# Patient Record
Sex: Female | Born: 1935 | Race: White | Hispanic: No | Marital: Married | Smoking: Never smoker
Health system: Southern US, Community
[De-identification: ages and names within clinical notes are randomized; demographics above are authoritative.]

## PROBLEM LIST (undated history)

## (undated) DIAGNOSIS — E7800 Pure hypercholesterolemia, unspecified: Secondary | ICD-10-CM

## (undated) DIAGNOSIS — I82409 Acute embolism and thrombosis of unspecified deep veins of unspecified lower extremity: Secondary | ICD-10-CM

## (undated) DIAGNOSIS — I639 Cerebral infarction, unspecified: Secondary | ICD-10-CM

## (undated) DIAGNOSIS — I2510 Atherosclerotic heart disease of native coronary artery without angina pectoris: Secondary | ICD-10-CM

## (undated) DIAGNOSIS — M1990 Unspecified osteoarthritis, unspecified site: Secondary | ICD-10-CM

## (undated) HISTORY — PX: BACK SURGERY: SHX140

## (undated) HISTORY — PX: ABDOMINAL HYSTERECTOMY: SHX81

---

## 2013-12-09 DEATH — deceased

## 2014-01-14 ENCOUNTER — Encounter (INDEPENDENT_AMBULATORY_CARE_PROVIDER_SITE_OTHER): Payer: Medicare Other | Admitting: Ophthalmology

## 2014-01-14 DIAGNOSIS — H3530 Unspecified macular degeneration: Secondary | ICD-10-CM

## 2014-01-14 DIAGNOSIS — H43819 Vitreous degeneration, unspecified eye: Secondary | ICD-10-CM

## 2020-05-22 ENCOUNTER — Encounter (HOSPITAL_BASED_OUTPATIENT_CLINIC_OR_DEPARTMENT_OTHER): Payer: Self-pay | Admitting: Emergency Medicine

## 2020-05-22 ENCOUNTER — Emergency Department (HOSPITAL_BASED_OUTPATIENT_CLINIC_OR_DEPARTMENT_OTHER)
Admission: EM | Admit: 2020-05-22 | Discharge: 2020-05-22 | Disposition: A | Discharge status: Home / Self Care | Payer: Medicare Other | Attending: Emergency Medicine | Admitting: Emergency Medicine

## 2020-05-22 ENCOUNTER — Emergency Department (HOSPITAL_BASED_OUTPATIENT_CLINIC_OR_DEPARTMENT_OTHER): Payer: Medicare Other

## 2020-05-22 ENCOUNTER — Other Ambulatory Visit: Payer: Self-pay

## 2020-05-22 DIAGNOSIS — S7002XA Contusion of left hip, initial encounter: Secondary | ICD-10-CM | POA: Insufficient documentation

## 2020-05-22 DIAGNOSIS — W19XXXA Unspecified fall, initial encounter: Secondary | ICD-10-CM | POA: Diagnosis not present

## 2020-05-22 DIAGNOSIS — Y9289 Other specified places as the place of occurrence of the external cause: Secondary | ICD-10-CM | POA: Insufficient documentation

## 2020-05-22 DIAGNOSIS — Y999 Unspecified external cause status: Secondary | ICD-10-CM | POA: Insufficient documentation

## 2020-05-22 DIAGNOSIS — Z7901 Long term (current) use of anticoagulants: Secondary | ICD-10-CM | POA: Insufficient documentation

## 2020-05-22 DIAGNOSIS — I2510 Atherosclerotic heart disease of native coronary artery without angina pectoris: Secondary | ICD-10-CM | POA: Diagnosis not present

## 2020-05-22 DIAGNOSIS — Y939 Activity, unspecified: Secondary | ICD-10-CM | POA: Diagnosis not present

## 2020-05-22 DIAGNOSIS — S8011XA Contusion of right lower leg, initial encounter: Secondary | ICD-10-CM | POA: Diagnosis not present

## 2020-05-22 DIAGNOSIS — S79912A Unspecified injury of left hip, initial encounter: Secondary | ICD-10-CM | POA: Diagnosis present

## 2020-05-22 DIAGNOSIS — Z79899 Other long term (current) drug therapy: Secondary | ICD-10-CM | POA: Insufficient documentation

## 2020-05-22 HISTORY — DX: Atherosclerotic heart disease of native coronary artery without angina pectoris: I25.10

## 2020-05-22 HISTORY — DX: Acute embolism and thrombosis of unspecified deep veins of unspecified lower extremity: I82.409

## 2020-05-22 HISTORY — DX: Pure hypercholesterolemia, unspecified: E78.00

## 2020-05-22 HISTORY — DX: Unspecified osteoarthritis, unspecified site: M19.90

## 2020-05-22 HISTORY — DX: Cerebral infarction, unspecified: I63.9

## 2020-05-22 NOTE — ED Provider Notes (Signed)
[facility] EMERGENCY DEPARTMENT Provider Note   CSN: [id number] Arrival date & time: 05/22/20  1104     History Chief Complaint  Patient presents with  . Fall    Tina Russo is a 86 y.o. female.  86 yo F with chief complaints of left hip pain after fall about a week ago.  She also noticed a knot to the posterior aspect of her right knee.  Family is concerned that she might have a DVT and brought her here for evaluation.  She otherwise has been acting normally.  Has been able to ambulate as per her baseline with a walker.  Denied head injury denies loss consciousness denies chest pain abdominal pain back pain.  The history is provided by the patient and a relative.  Fall This is a new problem. The current episode started more than 2 days ago. The problem occurs rarely. The problem has been resolved. Pertinent negatives include no chest pain, no abdominal pain, no headaches and no shortness of breath. Nothing aggravates the symptoms. Nothing relieves the symptoms. She has tried nothing for the symptoms. The treatment provided no relief.       Past Medical History:  Diagnosis Date  . Arthritis   . Coronary artery disease   . DVT (deep venous thrombosis) (HCC)   . High cholesterol   . Stroke [facility])     There are no problems to display for this patient.   Past Surgical History:  Procedure Laterality Date  . ABDOMINAL HYSTERECTOMY    . BACK SURGERY       OB History   No obstetric history on file.     No family history on file.  Social History   Tobacco Use  . Smoking status: Never Smoker  . Smokeless tobacco: Never Used  Substance Use Topics  . Alcohol use: Never  . Drug use: Never    Home Medications Prior to Admission medications   Medication Sig Start Date End Date Taking? Authorizing Provider  apixaban (ELIQUIS) 5 MG TABS tablet TAKE 2 TABS TWICE A DAY FOR ONE WEEK THEN ONE TAB TWICE A DAY 03/25/19  Yes [provider]  atorvastatin  (LIPITOR) 80 MG tablet Take 1 tablet by mouth daily. 12/06/16  Yes [provider]  citalopram (CELEXA) 40 MG tablet Take 1 tablet by mouth daily. 04/04/17  Yes [provider]  diclofenac Sodium (VOLTAREN) 1 % GEL Apply to affected area/heels up to 3 times daily as directed 02/13/19  Yes [provider]  ferrous sulfate 325 (65 FE) MG tablet TAKE 1 TABLET BY MOUTH EVERY DAY WITH BREAKFAST 01/31/17  Yes [provider]  furosemide (LASIX) 20 MG tablet Take by mouth. 05/04/20  Yes [provider]  latanoprost (XALATAN) 0.005 % ophthalmic solution Place 1 drop into both eyes nightly. 09/11/12  Yes [provider]  nitroGLYCERIN (NITROSTAT) 0.4 MG SL tablet Place under the tongue. 01/19/15  Yes [provider]  pantoprazole (PROTONIX) 20 MG tablet Take 1 tablet by mouth daily. 03/15/19  Yes [provider]  timolol (TIMOPTIC) 0.5 % ophthalmic solution Place 1 drop into both eyes 2 times daily. 06/07/14  Yes [provider]  acetaminophen (TYLENOL) 500 MG tablet Take by mouth.    [provider]  Calcium Citrate-Vitamin D 315-250 MG-UNIT TABS Take by mouth.    [provider]  glucosamine-chondroitin 500-400 MG tablet Take 1 tablet by mouth 2 (two) times daily.    [provider]  Multiple  Vitamins-Minerals (SYSTANE ICAPS AREDS2) CAPS Take by mouth.    [provider]    Allergies    Codeine  Review of Systems   Review of Systems  Constitutional: Negative for chills and fever.  HENT: Negative for congestion and rhinorrhea.   Eyes: Negative for redness and visual disturbance.  Respiratory: Negative for shortness of breath and wheezing.   Cardiovascular: Negative for chest pain and palpitations.  Gastrointestinal: Negative for abdominal pain, nausea and vomiting.  Genitourinary: Negative for dysuria and urgency.  Musculoskeletal: Positive for arthralgias and myalgias.  Skin: Negative for  pallor and wound.  Neurological: Negative for dizziness and headaches.    Physical Exam Updated Vital Signs BP (!) 154/94 (BP Location: Left Arm)   Pulse 75   Temp 98.7 F (37.1 C) (Oral)   Resp 19   Ht 5\' 1"  (1.549 m)   Wt 68 kg   SpO2 100%   BMI 28.34 kg/m   Physical Exam Vitals and nursing note reviewed.  Constitutional:      General: She is not in acute distress.    Appearance: She is well-developed. She is not diaphoretic.  HENT:     Head: Normocephalic and atraumatic.  Eyes:     Pupils: Pupils are equal, round, and reactive to light.  Cardiovascular:     Rate and Rhythm: Normal rate and regular rhythm.     Heart sounds: No murmur heard.  No friction rub. No gallop.   Pulmonary:     Effort: Pulmonary effort is normal.     Breath sounds: No wheezing or rales.  Abdominal:     General: There is no distension.     Palpations: Abdomen is soft.     Tenderness: There is no abdominal tenderness.  Musculoskeletal:        General: Tenderness present.     Cervical back: Normal range of motion and neck supple.     Comments: Bruising to the left lateral aspect of the upper leg distal to the greater trochanter.  Internal/external rotation without pain.  No pain with compression of the pelvis.  Small nodule within an area of hematoma to the right medial calf.  Skin:    General: Skin is warm and dry.  Neurological:     Mental Status: She is alert and oriented to person, place, and time.  Psychiatric:        Behavior: Behavior normal.     ED Results / Procedures / Treatments   Labs (all labs ordered are listed, but only abnormal results are displayed) Labs Reviewed - No data to display  EKG None  Radiology Venous Img Lower Unilateral Right  Result Date: 05/22/2020 CLINICAL DATA:  Recent fall with palpable lump in the medial aspect of the right knee EXAM: RIGHT LOWER EXTREMITY VENOUS DOPPLER ULTRASOUND TECHNIQUE: Gray-scale sonography with graded compression, as  well as color Doppler and duplex ultrasound were performed to evaluate the lower extremity deep venous systems from the level of the common femoral vein and including the common femoral, femoral, profunda femoral, popliteal and calf veins including the posterior tibial, peroneal and gastrocnemius veins when visible. The superficial great saphenous vein was also interrogated. Spectral Doppler was utilized to evaluate flow at rest and with distal augmentation maneuvers in the common femoral, femoral and popliteal veins. COMPARISON:  None. FINDINGS: Contralateral Common Femoral Vein: Respiratory phasicity is normal and symmetric with the symptomatic side. No evidence of thrombus. Normal compressibility. Common Femoral Vein: No evidence of thrombus. Normal compressibility,  respiratory phasicity and response to augmentation. Saphenofemoral Junction: No evidence of thrombus. Normal compressibility and flow on color Doppler imaging. Profunda Femoral Vein: No evidence of thrombus. Normal compressibility and flow on color Doppler imaging. Femoral Vein: Limited evaluation. Patient could not tolerate the pressure of the ultrasound probe. Normal patent and compressible femoral vein in the proximal and mid thigh. Limited assessment in the distal thigh. Popliteal Vein: No evidence of thrombus. Normal compressibility, respiratory phasicity and response to augmentation. Calf Veins: No evidence of thrombus. Normal compressibility and flow on color Doppler imaging. Superficial Great Saphenous Vein: No evidence of thrombus. Normal compressibility. Venous Reflux:  None. Other Findings: Limited sonographic interrogation of the region of concern in the medial popliteal fossa demonstrates a 1.2 x 1.1 x 0.8 cm complex hypoechoic collection. No internal vascularity. IMPRESSION: 1. No evidence of deep venous thrombosis in the right lower extremity. Of note, the examination is mildly limited secondary to patient's pain and inability to  tolerate imaging in the distal thigh. 2. Small heterogeneous fluid collection in the medial popliteal fossa may represent a hematoma, complex Baker's cyst, or, less likely, a small abscess. Electronically Signed   By: Malachy MoanHeath  McCullough M.D.   On: 05/22/2020 14:04   DG Hip Unilat W or Wo Pelvis 2-3 Views Left  Result Date: 05/22/2020 CLINICAL DATA:  86 year old who fell 5 days ago and has persistent LEFT hip pain. Initial encounter. EXAM: DG HIP (WITH OR WITHOUT PELVIS) 2-3V LEFT COMPARISON:  Bone window images from CT abdomen and pelvis 10/29/2019. FINDINGS: Bone mineral density well preserved for patient age. No evidence of acute or subacute fracture or dislocation. Joint space well-preserved for patient age. Included AP pelvis demonstrates a well-preserved joint space in the contralateral RIGHT hip. Sacroiliac joints and symphysis pubis anatomically aligned without diastasis, though moderate degenerative changes are present. Degenerative changes involving the visualized lower lumbar spine. IMPRESSION: No acute or subacute osseous abnormality. Electronically Signed   By: Hulan Saashomas  Lawrence M.D.   On: 05/22/2020 14:26    Procedures Procedures (including critical care time)  Medications Ordered in ED Medications - No data to display  ED Course  I have reviewed the triage vital signs and the nursing notes.  Pertinent labs & imaging results that were available during my care of the patient were reviewed by me and considered in my medical decision making (see chart for details).    MDM Rules/Calculators/A&P                          86 yo  F with a chief complaints of a fall.  The patient had tripped on a stool that was next to her bed.  Had a couple bruises to the legs that have been getting mildly better.  She noticed the nodule today and the family is concerned about a DVT and so brought her in for evaluation.  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  Plain film viewed by me without fracture.  DVT study negative.  Discharge home.  3:25 PM:  I have discussed the diagnosis/risks/treatment options with the patient and believe the pt to be eligible for discharge home to follow-up with PCP. We also discussed returning to the ED immediately if new or worsening sx occur. We discussed the sx which are most concerning (e.g., sudden worsening pain, fever, inability to tolerate by mouth) that necessitate immediate return. Medications  administered to the patient during their visit and any new prescriptions provided to the patient are listed below.  Medications given during this visit Medications - No data to display   The patient appears reasonably screen and/or stabilized for discharge and I doubt any other medical condition or other [facility] requiring further screening, evaluation, or treatment in the ED at this time prior to discharge.   Final Clinical Impression(s) / ED Diagnoses Final diagnoses:  Contusion of left hip, initial encounter  Hematoma of right lower leg    Rx / DC Orders ED Discharge Orders    None       Melene Plan, DO 05/22/20 1525

## 2020-05-22 NOTE — ED Notes (Signed)
US at bedside

## 2020-05-22 NOTE — ED Triage Notes (Addendum)
Tripped and fell over a stool 5 days ago. C/o L hip pain. Has been ambulatory at home. She is concerned about "knots" to the hip area.

## 2020-05-22 NOTE — ED Notes (Signed)
Ultrasound in process

## 2020-05-22 NOTE — Discharge Instructions (Signed)
Return for worsening pain or swelling

## 2021-07-21 ENCOUNTER — Encounter (HOSPITAL_BASED_OUTPATIENT_CLINIC_OR_DEPARTMENT_OTHER): Payer: Self-pay | Admitting: *Deleted

## 2021-07-21 ENCOUNTER — Emergency Department (HOSPITAL_BASED_OUTPATIENT_CLINIC_OR_DEPARTMENT_OTHER): Payer: Medicare Other

## 2021-07-21 ENCOUNTER — Emergency Department (HOSPITAL_BASED_OUTPATIENT_CLINIC_OR_DEPARTMENT_OTHER)
Admission: EM | Admit: 2021-07-21 | Discharge: 2021-07-21 | Disposition: A | Discharge status: Home / Self Care | Payer: Medicare Other | Attending: Emergency Medicine | Admitting: Emergency Medicine

## 2021-07-21 ENCOUNTER — Other Ambulatory Visit: Payer: Self-pay

## 2021-07-21 DIAGNOSIS — Z91040 Latex allergy status: Secondary | ICD-10-CM | POA: Diagnosis not present

## 2021-07-21 DIAGNOSIS — W08XXXA Fall from other furniture, initial encounter: Secondary | ICD-10-CM | POA: Insufficient documentation

## 2021-07-21 DIAGNOSIS — M79662 Pain in left lower leg: Secondary | ICD-10-CM | POA: Diagnosis not present

## 2021-07-21 DIAGNOSIS — Z7901 Long term (current) use of anticoagulants: Secondary | ICD-10-CM | POA: Insufficient documentation

## 2021-07-21 DIAGNOSIS — M25552 Pain in left hip: Secondary | ICD-10-CM | POA: Insufficient documentation

## 2021-07-21 DIAGNOSIS — I2510 Atherosclerotic heart disease of native coronary artery without angina pectoris: Secondary | ICD-10-CM | POA: Diagnosis not present

## 2021-07-21 DIAGNOSIS — W19XXXA Unspecified fall, initial encounter: Secondary | ICD-10-CM

## 2021-07-21 MED ORDER — ACETAMINOPHEN 325 MG PO TABS
650.0000 mg | ORAL_TABLET | Freq: Once | ORAL | Status: AC
  Administered 2021-07-21: 650 mg via ORAL
  Filled 2021-07-21: qty 2

## 2021-07-21 NOTE — Discharge Instructions (Addendum)
Discussed return precautions if pain worsens or fails to improve, or patient becomes unable to bear weight. You may use tylenol as needed for pain control. Please follow up with your PCP as normal.

## 2021-07-21 NOTE — ED Notes (Signed)
Taken to xray.

## 2021-07-21 NOTE — ED Triage Notes (Signed)
She was changing a pillow case yesterday and she fell forward. Injury to her left leg. Pain in her hip when she walks. Swelling to her lower leg and foot.

## 2021-07-21 NOTE — ED Provider Notes (Signed)
[facility] EMERGENCY DEPARTMENT Provider Note   CSN: [id number] Arrival date & time: 07/21/21  1048     History Chief Complaint  Patient presents with   Fall   Leg Injury    Tina Russo is a 87 y.o. female with past medical history significant for coronary artery disease, previous DVT, high cholesterol, stroke who presents after a fall yesterday from the couch onto the floor.  Patient reports that she fell onto her left leg.  Significant swelling noted the left lower leg.  Patient was able to ambulate afterwards, with some pain in the hip.  Patient denies any numbness, tingling.  Patient reports the fall was mechanical, nonsyncopal.  Patient is taking a blood thinner, however did not hit her head, did not lose consciousness.  Patient reports pain is 0/10 at rest, 5/10 with palpation. Patient did take tylenol several hours PTA.   Fall      Past Medical History:  Diagnosis Date   Arthritis    Coronary artery disease    DVT (deep venous thrombosis) (HCC)    High cholesterol    Stroke (HCC)     There are no problems to display for this patient.   Past Surgical History:  Procedure Laterality Date   ABDOMINAL HYSTERECTOMY     BACK SURGERY       OB History   No obstetric history on file.     No family history on file.  Social History   Tobacco Use   Smoking status: Never   Smokeless tobacco: Never  Vaping Use   Vaping Use: Never used  Substance Use Topics   Alcohol use: Never   Drug use: Never    Home Medications Prior to Admission medications   Medication Sig Start Date End Date Taking? Authorizing Provider  acetaminophen (TYLENOL) 500 MG tablet Take by mouth.    [provider]  apixaban (ELIQUIS) 5 MG TABS tablet TAKE 2 TABS TWICE A DAY FOR ONE WEEK THEN ONE TAB TWICE A DAY 03/25/19   [provider]  atorvastatin (LIPITOR) 80 MG tablet Take 1 tablet by mouth daily. 12/06/16   [provider]  Calcium Citrate-Vitamin D  315-250 MG-UNIT TABS Take by mouth.    [provider]  citalopram (CELEXA) 40 MG tablet Take 1 tablet by mouth daily. 04/04/17   [provider]  diclofenac Sodium (VOLTAREN) 1 % GEL Apply to affected area/heels up to 3 times daily as directed 02/13/19   [provider]  ferrous sulfate 325 (65 FE) MG tablet TAKE 1 TABLET BY MOUTH EVERY DAY WITH BREAKFAST 01/31/17   [provider]  furosemide (LASIX) 20 MG tablet Take by mouth. 05/04/20   [provider]  glucosamine-chondroitin 500-400 MG tablet Take 1 tablet by mouth 2 (two) times daily.    [provider]  latanoprost (XALATAN) 0.005 % ophthalmic solution Place 1 drop into both eyes nightly. 09/11/12   [provider]  Multiple Vitamins-Minerals (SYSTANE ICAPS AREDS2) CAPS Take by mouth.    [provider]  nitroGLYCERIN (NITROSTAT) 0.4 MG SL tablet Place under the tongue. 01/19/15   [provider]  pantoprazole (PROTONIX) 20 MG tablet Take 1 tablet by mouth daily. 03/15/19   [provider]  timolol (TIMOPTIC) 0.5 % ophthalmic solution Place 1 drop into both eyes 2 times daily. 06/07/14   [provider]    Allergies    Contrast media [iodinated diagnostic agents], Shellfish allergy, Shellfish-derived products, Codeine, Iodine, Latex, Prednisone,  and Tramadol  Review of Systems   Review of Systems  Musculoskeletal:  Positive for joint swelling and myalgias.  All other systems reviewed and are negative.  Physical Exam Updated Vital Signs BP (!) 171/96 (BP Location: Left Arm)   Pulse 75   Temp 98.1 F (36.7 C) (Oral)   Resp 15   Ht 5\' 1"  (1.549 m)   Wt 63.5 kg   SpO2 98%   BMI 26.45 kg/m   Physical Exam Vitals and nursing note reviewed.  Constitutional:      General: She is not in acute distress.    Appearance: Normal appearance.  HENT:     Head: Normocephalic and atraumatic.  Eyes:     General:        Right eye: No discharge.         Left eye: No discharge.  Cardiovascular:     Rate and Rhythm: Normal rate and regular rhythm.     Pulses: Normal pulses.  Pulmonary:     Effort: Pulmonary effort is normal. No respiratory distress.  Musculoskeletal:     Comments: Patient with significant tenderness, swelling, ecchymosis noted to the lateral aspect of the distal left leg.  Patient with no tenderness to palpation of the left knee, left ankle, left foot.  Intact range of motion at ankle, knee, hip.  Patient does have some tenderness to palpation of the left hip without step-off or deformity.  Patient has equal leg lengths, no internal or external rotation of the foot at rest.  Patient has been able to ambulate with minimal difficulty.  Skin:    General: Skin is warm and dry.     Capillary Refill: Capillary refill takes less than 2 seconds.  Neurological:     Mental Status: She is alert and oriented to person, place, and time.  Psychiatric:        Mood and Affect: Mood normal.        Behavior: Behavior normal.    ED Results / Procedures / Treatments   Labs (all labs ordered are listed, but only abnormal results are displayed) Labs Reviewed - No data to display  EKG None  Radiology DG Tibia/Fibula Left  Result Date: 07/21/2021 CLINICAL DATA:  Fall, leg pain EXAM: LEFT TIBIA AND FIBULA - 2 VIEW COMPARISON:  None. FINDINGS: No acute fracture or dislocation identified. Bones are osteopenic. Area of apparent mild soft tissue swelling at the anterior superior lower leg. Arterial vascular calcifications. IMPRESSION: No acute osseous abnormality identified. Electronically Signed   By: 13/07/2021 M.D.   On: 07/21/2021 12:41   DG Hip Unilat W or Wo Pelvis 2-3 Views Left  Result Date: 07/21/2021 CLINICAL DATA:  Fall, pain EXAM: DG HIP (WITH OR WITHOUT PELVIS) 2-3V LEFT COMPARISON:  None. FINDINGS: There is no evidence of hip fracture or dislocation. There is mild bilateral hip joint space narrowing with marginal  spurring. Advanced degenerate disc disease of the lower lumbar spine with partially imaged posterior spinal fusion hardware. IMPRESSION: No acute fracture or dislocation. Electronically Signed   By: 13/07/2021 D.O.   On: 07/21/2021 12:44    Procedures Procedures   Medications Ordered in ED Medications  acetaminophen (TYLENOL) tablet 650 mg (650 mg Oral Given 07/21/21 1216)    ED Course  I have reviewed the triage vital signs and the nursing notes.  Pertinent labs & imaging results that were available during my care of the patient were reviewed by me and considered in my medical decision making (  see chart for details).    MDM Rules/Calculators/A&P                         I discussed this case with my attending physician who cosigned this note including patient's presenting symptoms, physical exam, and planned diagnostics and interventions. Attending physician stated agreement with plan or made changes to plan which were implemented.   Attending physician assessed patient at bedside.  Otherwise healthy 87 year old female sustained mechanical, nonsyncopal fall primarily on the left distal lower leg.  Patient does have evidence of bruising versus small hematoma.  Leg is neurovascularly intact.  Patient denies any back pain, numbness, tingling, saddle anesthesia, difficulty with urination, defecation.  Patient has no pain at rest.  Minimal concern for hip fracture versus different dislocation as patient has equal leg lengths, and only minimal tenderness to palpation, however high risk factor and in 87 year old patient.  Will obtain radiographic imaging of the left tibia/fib as well as left hip.  We will administer Tylenol for pain control during radiographic manipulation.  Radiographic imaging shows no fracture or dislocation. Able to weight bear with walker as normal. Minimal clinical concern for occult hip fracture. Patient discharged with reassurance.  Final Clinical Impression(s) / ED  Diagnoses Final diagnoses:  Fall, initial encounter    Rx / DC Orders ED Discharge Orders     None        Olene Floss, PA-C 07/21/21 1324    Milagros Loll, MD 07/25/21 [phone]

## 2021-07-27 ENCOUNTER — Encounter (HOSPITAL_BASED_OUTPATIENT_CLINIC_OR_DEPARTMENT_OTHER): Payer: Self-pay

## 2021-07-27 ENCOUNTER — Emergency Department (HOSPITAL_BASED_OUTPATIENT_CLINIC_OR_DEPARTMENT_OTHER): Payer: Medicare Other

## 2021-07-27 ENCOUNTER — Emergency Department (HOSPITAL_BASED_OUTPATIENT_CLINIC_OR_DEPARTMENT_OTHER)
Admission: EM | Admit: 2021-07-27 | Discharge: 2021-07-27 | Disposition: A | Discharge status: Home / Self Care | Payer: Medicare Other | Attending: Emergency Medicine | Admitting: Emergency Medicine

## 2021-07-27 ENCOUNTER — Other Ambulatory Visit: Payer: Self-pay

## 2021-07-27 DIAGNOSIS — Z79899 Other long term (current) drug therapy: Secondary | ICD-10-CM | POA: Diagnosis not present

## 2021-07-27 DIAGNOSIS — M79662 Pain in left lower leg: Secondary | ICD-10-CM | POA: Insufficient documentation

## 2021-07-27 DIAGNOSIS — I2510 Atherosclerotic heart disease of native coronary artery without angina pectoris: Secondary | ICD-10-CM | POA: Diagnosis not present

## 2021-07-27 DIAGNOSIS — Z91040 Latex allergy status: Secondary | ICD-10-CM | POA: Insufficient documentation

## 2021-07-27 DIAGNOSIS — S8012XA Contusion of left lower leg, initial encounter: Secondary | ICD-10-CM | POA: Diagnosis not present

## 2021-07-27 DIAGNOSIS — M79605 Pain in left leg: Secondary | ICD-10-CM

## 2021-07-27 DIAGNOSIS — S8992XA Unspecified injury of left lower leg, initial encounter: Secondary | ICD-10-CM | POA: Diagnosis present

## 2021-07-27 DIAGNOSIS — Z7901 Long term (current) use of anticoagulants: Secondary | ICD-10-CM | POA: Diagnosis not present

## 2021-07-27 DIAGNOSIS — W19XXXA Unspecified fall, initial encounter: Secondary | ICD-10-CM | POA: Insufficient documentation

## 2021-07-27 MED ORDER — ACETAMINOPHEN 325 MG PO TABS
650.0000 mg | ORAL_TABLET | Freq: Once | ORAL | Status: AC
  Administered 2021-07-27: 12:00:00 650 mg via ORAL
  Filled 2021-07-27: qty 2

## 2021-07-27 NOTE — ED Notes (Signed)
US at bedside

## 2021-07-27 NOTE — ED Notes (Signed)
Dr. Wilkie Aye at bedside assessing pt at this time.

## 2021-07-27 NOTE — ED Notes (Signed)
Pt assisted to [facility], tolerated well. Pt daughter requesting Tylenol for patient. Will notify MD.

## 2021-07-27 NOTE — Discharge Instructions (Signed)
You have been seen and discharged from the emergency department.  You continue to have a hematoma from your injury.  Otherwise the blood flow is normal to the foot, ultrasound shows no DVT.  Continue to take your medications as prescribed.  Use the Ace wrap for gentle compression.  Elevate and ice the leg.  Follow-up with your primary provider for reevaluation and further care. If you have any worsening symptoms, worsening swelling of the leg, discoloration of the left foot or further concerns for your health please return to an emergency department for further evaluation.

## 2021-07-27 NOTE — ED Provider Notes (Signed)
[facility] EMERGENCY DEPARTMENT Provider Note   CSN: [id number] Arrival date & time: 07/27/21  4098     History Chief Complaint  Patient presents with   Leg Pain    Tina Russo is a 87 y.o. female.  HPI  88 year old female with past medical history of HLD, CVA, DVT anticoagulant Eliquis presents emergency department with left lower leg pain and swelling.  Patient had a fall last week, was evaluated here.  Noted to have swelling, ecchymosis from mechanical injury.  X-rays were negative.  Patient has not been elevating or icing the leg is much as she should per the daughter.  The daughter believes that the patient's been compliant with her Eliquis.  Presents today with ongoing swelling and bruising with concern for possible DVT.  Patient continues to have discomfort in the leg.  Denies any foot discoloration or cold foot/numbness.  No new injury.  Past Medical History:  Diagnosis Date   Arthritis    Coronary artery disease    DVT (deep venous thrombosis) (HCC)    High cholesterol    Stroke (HCC)     There are no problems to display for this patient.   Past Surgical History:  Procedure Laterality Date   ABDOMINAL HYSTERECTOMY     BACK SURGERY       OB History   No obstetric history on file.     No family history on file.  Social History   Tobacco Use   Smoking status: Never   Smokeless tobacco: Never  Vaping Use   Vaping Use: Never used  Substance Use Topics   Alcohol use: Never   Drug use: Never    Home Medications Prior to Admission medications   Medication Sig Start Date End Date Taking? Authorizing Provider  acetaminophen (TYLENOL) 500 MG tablet Take by mouth.    [provider]  apixaban (ELIQUIS) 5 MG TABS tablet TAKE 2 TABS TWICE A DAY FOR ONE WEEK THEN ONE TAB TWICE A DAY 03/25/19   [provider]  atorvastatin (LIPITOR) 80 MG tablet Take 1 tablet by mouth daily. 12/06/16   [provider]  Calcium  Citrate-Vitamin D 315-250 MG-UNIT TABS Take by mouth.    [provider]  citalopram (CELEXA) 40 MG tablet Take 1 tablet by mouth daily. 04/04/17   [provider]  diclofenac Sodium (VOLTAREN) 1 % GEL Apply to affected area/heels up to 3 times daily as directed 02/13/19   [provider]  ferrous sulfate 325 (65 FE) MG tablet TAKE 1 TABLET BY MOUTH EVERY DAY WITH BREAKFAST 01/31/17   [provider]  furosemide (LASIX) 20 MG tablet Take by mouth. 05/04/20   [provider]  glucosamine-chondroitin 500-400 MG tablet Take 1 tablet by mouth 2 (two) times daily.    [provider]  latanoprost (XALATAN) 0.005 % ophthalmic solution Place 1 drop into both eyes nightly. 09/11/12   [provider]  Multiple Vitamins-Minerals (SYSTANE ICAPS AREDS2) CAPS Take by mouth.    [provider]  nitroGLYCERIN (NITROSTAT) 0.4 MG SL tablet Place under the tongue. 01/19/15   [provider]  pantoprazole (PROTONIX) 20 MG tablet Take 1 tablet by mouth daily. 03/15/19   [provider]  timolol (TIMOPTIC) 0.5 % ophthalmic solution Place 1 drop into both eyes 2 times daily. 06/07/14   [provider]    Allergies    Contrast media [iodinated diagnostic agents], Shellfish allergy, Shellfish-derived products, Codeine, Iodine, Latex, Prednisone, and Tramadol  Review  of Systems   Review of Systems  Constitutional:  Negative for fever.  Respiratory:  Negative for shortness of breath.   Cardiovascular:  Negative for chest pain.  Gastrointestinal:  Negative for abdominal pain.  Musculoskeletal:        Left lower extremity swelling, bruising and pain  Skin:  Negative for pallor.  Neurological:  Negative for numbness.   Physical Exam Updated Vital Signs BP 138/79 (BP Location: Right Arm)   Pulse 75   Temp 98.1 F (36.7 C) (Oral)   Resp 16   SpO2 97%   Physical Exam Vitals and nursing note reviewed.  Constitutional:       Appearance: Normal appearance.  HENT:     Head: Normocephalic.     Mouth/Throat:     Mouth: Mucous membranes are moist.  Cardiovascular:     Rate and Rhythm: Normal rate.  Pulmonary:     Effort: Pulmonary effort is normal.  Musculoskeletal:     Comments: Left lower extremity is swollen from the knee to the ankle with majority of bruising and edema on the lateral aspect overlying the fibula, tender to touch, foot appears well perfused, equal palpable DP pulses, no popliteal fossa swelling, no signs of compartment syndrome  Skin:    General: Skin is warm.     Coloration: Skin is not pale.     Findings: Bruising present.  Neurological:     Mental Status: She is alert and oriented to person, place, and time. Mental status is at baseline.  Psychiatric:        Mood and Affect: Mood normal.    ED Results / Procedures / Treatments   Labs (all labs ordered are listed, but only abnormal results are displayed) Labs Reviewed - No data to display  EKG None  Radiology No results found.  Procedures Procedures   Medications Ordered in ED Medications - No data to display  ED Course  I have reviewed the triage vital signs and the nursing notes.  Pertinent labs & imaging results that were available during my care of the patient were reviewed by me and considered in my medical decision making (see chart for details).    MDM Rules/Calculators/A&P                           87 year old female presents the emergency department accompanied by daughter with concern for ongoing swelling of the left lower extremity.  Was initially seen here for mechanical fall and injury to the leg.  X-rays are negative.  She continues to have swelling and ecchymosis of the left leg.  No pictures to compare to but daughter states that it is not progressively worsened.  They come today with concern that she could have developed a DVT.  However unlikely there was questionable of possible noncompliance.  The left  lower extremity has mainly lateral swelling and ecchymosis over the fibula, foot is neurovascularly intact.  Due to possible noncompliance and ultrasound was done which shows no DVT.  Will place Ace wrap, encourage elevation, ice and compliance with anticoagulation.  No signs of compartment syndrome.  Patient at this time appears safe and stable for discharge and will be treated as an outpatient.  Discharge plan and strict return to ED precautions discussed, patient verbalizes understanding and agreement.  Final Clinical Impression(s) / ED Diagnoses Final diagnoses:  None    Rx / DC Orders ED Discharge Orders     None  Rozelle Logan, DO 07/27/21 1155

## 2021-07-27 NOTE — ED Triage Notes (Signed)
Pt presents to ED from home C/O continued L lower leg pain since fall last week. Notable swelling and bruising to L lower leg. Pt reports blood thinner use. Denies additional fall since last visit.

## 2021-09-06 ENCOUNTER — Encounter (HOSPITAL_BASED_OUTPATIENT_CLINIC_OR_DEPARTMENT_OTHER): Payer: Self-pay | Admitting: *Deleted

## 2021-09-06 ENCOUNTER — Other Ambulatory Visit: Payer: Self-pay

## 2021-09-06 ENCOUNTER — Emergency Department (HOSPITAL_BASED_OUTPATIENT_CLINIC_OR_DEPARTMENT_OTHER): Payer: Medicare Other

## 2021-09-06 ENCOUNTER — Emergency Department (HOSPITAL_BASED_OUTPATIENT_CLINIC_OR_DEPARTMENT_OTHER)
Admission: EM | Admit: 2021-09-06 | Discharge: 2021-09-07 | Disposition: A | Discharge status: Home / Self Care | Payer: Medicare Other | Attending: Emergency Medicine | Admitting: Emergency Medicine

## 2021-09-06 DIAGNOSIS — R0602 Shortness of breath: Secondary | ICD-10-CM

## 2021-09-06 DIAGNOSIS — I10 Essential (primary) hypertension: Secondary | ICD-10-CM | POA: Diagnosis not present

## 2021-09-06 DIAGNOSIS — Z20822 Contact with and (suspected) exposure to covid-19: Secondary | ICD-10-CM | POA: Insufficient documentation

## 2021-09-06 DIAGNOSIS — R519 Headache, unspecified: Secondary | ICD-10-CM | POA: Insufficient documentation

## 2021-09-06 DIAGNOSIS — I2510 Atherosclerotic heart disease of native coronary artery without angina pectoris: Secondary | ICD-10-CM | POA: Diagnosis not present

## 2021-09-06 DIAGNOSIS — R109 Unspecified abdominal pain: Secondary | ICD-10-CM | POA: Insufficient documentation

## 2021-09-06 LAB — COMPREHENSIVE METABOLIC PANEL
ALT: 23 U/L (ref 0–44)
AST: 44 U/L — ABNORMAL HIGH (ref 15–41)
Albumin: 3.7 g/dL (ref 3.5–5.0)
Alkaline Phosphatase: 39 U/L (ref 38–126)
Anion gap: 10 (ref 5–15)
BUN: 13 mg/dL (ref 8–23)
CO2: 24 mmol/L (ref 22–32)
Calcium: 8.8 mg/dL — ABNORMAL LOW (ref 8.9–10.3)
Chloride: 102 mmol/L (ref 98–111)
Creatinine, Ser: 0.82 mg/dL (ref 0.44–1.00)
GFR, Estimated: >60 mL/min (ref >60)
Glucose, Bld: 106 mg/dL — ABNORMAL HIGH (ref 70–99)
Potassium: 3.6 mmol/L (ref 3.5–5.1)
Sodium: 136 mmol/L (ref 135–145)
Total Bilirubin: 1 mg/dL (ref 0.3–1.2)
Total Protein: 6.3 g/dL — ABNORMAL LOW (ref 6.5–8.1)

## 2021-09-06 LAB — CBC WITH DIFFERENTIAL/PLATELET
Abs Immature Granulocytes: 0.02 10*3/uL (ref 0.00–0.07)
Basophils Absolute: 0.1 10*3/uL (ref 0.0–0.1)
Basophils Relative: 1 %
Eosinophils Absolute: 0.2 10*3/uL (ref 0.0–0.5)
Eosinophils Relative: 3 %
HCT: 35.5 % — ABNORMAL LOW (ref 36.0–46.0)
Hemoglobin: 11.3 g/dL — ABNORMAL LOW (ref 12.0–15.0)
Immature Granulocytes: 0 %
Lymphocytes Relative: 13 %
Lymphs Abs: 0.9 10*3/uL (ref 0.7–4.0)
MCH: 30.3 pg (ref 26.0–34.0)
MCHC: 31.8 g/dL (ref 30.0–36.0)
MCV: 95.2 fL (ref 80.0–100.0)
Monocytes Absolute: 0.8 10*3/uL (ref 0.1–1.0)
Monocytes Relative: 11 %
Neutro Abs: 5.1 10*3/uL (ref 1.7–7.7)
Neutrophils Relative %: 72 %
Platelets: 222 10*3/uL (ref 150–400)
RBC: 3.73 MIL/uL — ABNORMAL LOW (ref 3.87–5.11)
RDW: 14.7 % (ref 11.5–15.5)
WBC: 7.1 10*3/uL (ref 4.0–10.5)
nRBC: 0 % (ref 0.0–0.2)

## 2021-09-06 LAB — RESP PANEL BY RT-PCR (FLU A&B, COVID) ARPGX2
Influenza A by PCR: NEGATIVE
Influenza B by PCR: NEGATIVE
SARS Coronavirus 2 by RT PCR: NEGATIVE

## 2021-09-06 MED ORDER — ACETAMINOPHEN 500 MG PO TABS
1000.0000 mg | ORAL_TABLET | Freq: Once | ORAL | Status: AC
  Administered 2021-09-06: 1000 mg via ORAL
  Filled 2021-09-06: qty 2

## 2021-09-06 NOTE — ED Notes (Signed)
RT to triage for eval  °

## 2021-09-06 NOTE — ED Triage Notes (Addendum)
Daughter reports SOB and increased BP, h/a  x 1  day

## 2021-09-06 NOTE — ED Provider Notes (Signed)
[facility] Emergency Department Provider Note MRN:  [id number]  Arrival date & time: 09/07/21     Chief Complaint   Shortness of Breath   History of Present Illness   Tina Russo is a 87 y.o. year-old female with a history of CAD, DVT, stroke presenting to the ED with chief complaint of shortness of breath.  Patient mostly brought here because physical therapy noted her blood pressure to be high.  She has been having some increased shortness of breath for the past 1 or 2 days.  She is endorsing a headache today.  She is endorsing left flank/left upper quadrant pain for the past 2 hours since being here in the emergency department.  Denies chest pain.  No numbness or weakness to the arms or legs.  Chronic leg pain that is unchanged.  Symptoms mild, intermittent, no exacerbating or alleviating factors.  Review of Systems  A complete 10 system review of systems was obtained and all systems are negative except as noted in the HPI and PMH.   Patient's Health History    Past Medical History:  Diagnosis Date   Arthritis    Coronary artery disease    DVT (deep venous thrombosis) (HCC)    High cholesterol    Stroke [facility])     Past Surgical History:  Procedure Laterality Date   ABDOMINAL HYSTERECTOMY     BACK SURGERY      No family history on file.  Social History   Socioeconomic History   Marital status: Married    Spouse name: Not on file   Number of children: Not on file   Years of education: Not on file   Highest education level: Not on file  Occupational History   Not on file  Tobacco Use   Smoking status: Never   Smokeless tobacco: Never  Vaping Use   Vaping Use: Never used  Substance and Sexual Activity   Alcohol use: Never   Drug use: Never   Sexual activity: Not on file  Other Topics Concern   Not on file  Social History Narrative   Not on file   Social Determinants of Health   Financial Resource Strain: Not on file  Food  Insecurity: Not on file  Transportation Needs: Not on file  Physical Activity: Not on file  Stress: Not on file  Social Connections: Not on file  Intimate Partner Violence: Not on file     Physical Exam   Vitals:   09/06/21 2300 09/07/21 0003  BP: (!) 180/105 (!) 180/105  Pulse: 76 76  Resp: 18 18  Temp:    SpO2: 97% 97%    CONSTITUTIONAL: Well-appearing, NAD NEURO:  Alert and oriented x 3, no focal deficits EYES:  eyes equal and reactive ENT/NECK:  no LAD, no JVD CARDIO: Regular rate, well-perfused, normal S1 and S2 PULM:  CTAB no wheezing or rhonchi GI/GU:  normal bowel sounds, non-distended, non-tender MSK/SPINE:  No gross deformities, bilateral lower extremity edema SKIN:  no rash, atraumatic PSYCH:  Appropriate speech and behavior  *Additional and/or pertinent findings included in MDM below  Diagnostic and Interventional Summary    EKG Interpretation  Date/Time:  Wednesday September 06 2021 18:08:20 EST Ventricular Rate:  79 PR Interval:    QRS Duration: 150 QT Interval:  418 QTC Calculation: 479 R Axis:   264 Text Interpretation: Ventricular-paced rhythm Abnormal ECG No previous ECGs available Confirmed by Gerlene Fee [phone]) on 09/06/2021 11:40:21 PM  Labs Reviewed  CBC WITH DIFFERENTIAL/PLATELET - Abnormal; Notable for the following components:      Result Value   RBC 3.73 (*)    Hemoglobin 11.3 (*)    HCT 35.5 (*)    All other components within normal limits  COMPREHENSIVE METABOLIC PANEL - Abnormal; Notable for the following components:   Glucose, Bld 106 (*)    Calcium 8.8 (*)    Total Protein 6.3 (*)    AST 44 (*)    All other components within normal limits  RESP PANEL BY RT-PCR (FLU A&B, COVID) ARPGX2  LIPASE, BLOOD  TROPONIN I (HIGH SENSITIVITY)    CT HEAD WO CONTRAST (5MM)  Final Result    CT RENAL STONE STUDY  Final Result    DG Chest 2 View  Final Result      Medications  acetaminophen (TYLENOL) tablet 1,000 mg (1,000  mg Oral Given 09/06/21 2348)     Procedures  /  Critical Care Procedures  ED Course and Medical Decision Making  I have reviewed the triage vital signs, the nursing notes, and pertinent available records from the EMR.  Listed above are laboratory and imaging tests that I personally ordered, reviewed, and interpreted and then considered in my medical decision making (see below for details).  Myriad of symptoms in the setting of high blood pressure, overall patient is sitting comfortably no acute distress asking for more blankets.  Needs to be redirected to remember any of her symptoms.  She does not appear short of breath.  She has no abdominal tenderness on exam.  Given the headache and high blood pressure will obtain screening CT head.  Has a history of kidney stones and is having some left flank pain, will obtain CT renal study.  Labs and chest x-ray thus far reassuring.     On reassessment patient continues to look well, resting comfortably in no acute distress.  Work-up is reassuring, appropriate for discharge.  Barth Kirks. Sedonia Small, [place] [email or web address]  Final Clinical Impressions(s) / ED Diagnoses     ICD-10-CM   1. Hypertension, unspecified type  I10     2. SOB (shortness of breath)  R06.02     3. Flank pain  R10.9     4. Acute nonintractable headache, unspecified headache type  R51.9       ED Discharge Orders     None        Discharge Instructions Discussed with and Provided to Patient:     Discharge Instructions      You were evaluated in the Emergency Department and after careful evaluation, we did not find any emergent condition requiring admission or further testing in the hospital.  Your exam/testing today was overall reassuring.  Recommend close follow-up with your primary care doctor to discuss your symptoms and your high blood pressure.  Please return to the Emergency Department if you  experience any worsening of your condition.  Thank you for allowing [place] to be a part of your care.         Maudie Flakes, MD 09/07/21 [phone]

## 2021-09-07 LAB — TROPONIN I (HIGH SENSITIVITY): Troponin I (High Sensitivity): 15 ng/L (ref <18)

## 2021-09-07 LAB — LIPASE, BLOOD: Lipase: 33 U/L (ref 11–51)

## 2021-09-07 NOTE — Discharge Instructions (Signed)
You were evaluated in the Emergency Department and after careful evaluation, we did not find any emergent condition requiring admission or further testing in the hospital.  Your exam/testing today was overall reassuring.  Recommend close follow-up with your primary care doctor to discuss your symptoms and your high blood pressure.  Please return to the Emergency Department if you experience any worsening of your condition.  Thank you for allowing [place] to be a part of your care.

## 2022-02-15 ENCOUNTER — Emergency Department (HOSPITAL_BASED_OUTPATIENT_CLINIC_OR_DEPARTMENT_OTHER)
Admission: EM | Admit: 2022-02-15 | Discharge: 2022-02-16 | Disposition: A | Discharge status: Home / Self Care | Payer: Medicare Other | Attending: Emergency Medicine | Admitting: Emergency Medicine

## 2022-02-15 ENCOUNTER — Emergency Department (HOSPITAL_BASED_OUTPATIENT_CLINIC_OR_DEPARTMENT_OTHER): Payer: Medicare Other

## 2022-02-15 ENCOUNTER — Other Ambulatory Visit: Payer: Self-pay

## 2022-02-15 ENCOUNTER — Encounter (HOSPITAL_BASED_OUTPATIENT_CLINIC_OR_DEPARTMENT_OTHER): Payer: Self-pay | Admitting: Emergency Medicine

## 2022-02-15 DIAGNOSIS — Z7901 Long term (current) use of anticoagulants: Secondary | ICD-10-CM | POA: Diagnosis not present

## 2022-02-15 DIAGNOSIS — J40 Bronchitis, not specified as acute or chronic: Secondary | ICD-10-CM | POA: Diagnosis not present

## 2022-02-15 DIAGNOSIS — Z20822 Contact with and (suspected) exposure to covid-19: Secondary | ICD-10-CM | POA: Insufficient documentation

## 2022-02-15 DIAGNOSIS — Z91040 Latex allergy status: Secondary | ICD-10-CM | POA: Diagnosis not present

## 2022-02-15 DIAGNOSIS — I4891 Unspecified atrial fibrillation: Secondary | ICD-10-CM | POA: Diagnosis not present

## 2022-02-15 DIAGNOSIS — R0602 Shortness of breath: Secondary | ICD-10-CM | POA: Diagnosis present

## 2022-02-15 LAB — CBC WITH DIFFERENTIAL/PLATELET
Abs Immature Granulocytes: 0.02 10*3/uL (ref 0.00–0.07)
Basophils Absolute: 0.1 10*3/uL (ref 0.0–0.1)
Basophils Relative: 1 %
Eosinophils Absolute: 0.4 10*3/uL (ref 0.0–0.5)
Eosinophils Relative: 5 %
HCT: 40.1 % (ref 36.0–46.0)
Hemoglobin: 13.1 g/dL (ref 12.0–15.0)
Immature Granulocytes: 0 %
Lymphocytes Relative: 14 %
Lymphs Abs: 1.1 10*3/uL (ref 0.7–4.0)
MCH: 31 pg (ref 26.0–34.0)
MCHC: 32.7 g/dL (ref 30.0–36.0)
MCV: 94.8 fL (ref 80.0–100.0)
Monocytes Absolute: 1.1 10*3/uL — ABNORMAL HIGH (ref 0.1–1.0)
Monocytes Relative: 15 %
Neutro Abs: 4.8 10*3/uL (ref 1.7–7.7)
Neutrophils Relative %: 65 %
Platelets: 222 10*3/uL (ref 150–400)
RBC: 4.23 MIL/uL (ref 3.87–5.11)
RDW: 15.6 % — ABNORMAL HIGH (ref 11.5–15.5)
WBC: 7.5 10*3/uL (ref 4.0–10.5)
nRBC: 0 % (ref 0.0–0.2)

## 2022-02-15 LAB — I-STAT VENOUS BLOOD GAS, ED
Acid-Base Excess: 6 mmol/L — ABNORMAL HIGH (ref 0.0–2.0)
Bicarbonate: 31.5 mmol/L — ABNORMAL HIGH (ref 20.0–28.0)
Calcium, Ion: 1.22 mmol/L (ref 1.15–1.40)
HCT: 37 % (ref 36.0–46.0)
Hemoglobin: 12.6 g/dL (ref 12.0–15.0)
O2 Saturation: 32 %
Patient temperature: 98.4
Potassium: 4.3 mmol/L (ref 3.5–5.1)
Sodium: 137 mmol/L (ref 135–145)
TCO2: 33 mmol/L — ABNORMAL HIGH (ref 22–32)
pCO2, Ven: 50.4 mmHg (ref 44–60)
pH, Ven: 7.404 (ref 7.25–7.43)
pO2, Ven: 20 mmHg — CL (ref 32–45)

## 2022-02-15 LAB — COMPREHENSIVE METABOLIC PANEL
ALT: 18 U/L (ref 0–44)
AST: 29 U/L (ref 15–41)
Albumin: 3.7 g/dL (ref 3.5–5.0)
Alkaline Phosphatase: 53 U/L (ref 38–126)
Anion gap: 9 (ref 5–15)
BUN: 14 mg/dL (ref 8–23)
CO2: 28 mmol/L (ref 22–32)
Calcium: 9.1 mg/dL (ref 8.9–10.3)
Chloride: 101 mmol/L (ref 98–111)
Creatinine, Ser: 1.13 mg/dL — ABNORMAL HIGH (ref 0.44–1.00)
GFR, Estimated: 45 mL/min — ABNORMAL LOW (ref >60)
Glucose, Bld: 106 mg/dL — ABNORMAL HIGH (ref 70–99)
Potassium: 4.6 mmol/L (ref 3.5–5.1)
Sodium: 138 mmol/L (ref 135–145)
Total Bilirubin: 0.7 mg/dL (ref 0.3–1.2)
Total Protein: 6.9 g/dL (ref 6.5–8.1)

## 2022-02-15 LAB — LACTIC ACID, PLASMA: Lactic Acid, Venous: 1.1 mmol/L (ref 0.5–1.9)

## 2022-02-15 LAB — SARS CORONAVIRUS 2 BY RT PCR: SARS Coronavirus 2 by RT PCR: NEGATIVE

## 2022-02-15 MED ORDER — DOXYCYCLINE HYCLATE 100 MG PO CAPS: 100.0000 mg | ORAL_CAPSULE | Freq: Two times a day (BID) | ORAL | 0 refills | Status: AC

## 2022-02-15 MED ORDER — ALBUTEROL SULFATE HFA 108 (90 BASE) MCG/ACT IN AERS
2.0000 | INHALATION_SPRAY | Freq: Once | RESPIRATORY_TRACT | Status: AC
  Administered 2022-02-15: 2 via RESPIRATORY_TRACT
  Filled 2022-02-15: qty 6.7

## 2022-02-15 MED ORDER — SODIUM CHLORIDE 0.9 % IV SOLN
1.0000 g | Freq: Once | INTRAVENOUS | Status: AC
  Administered 2022-02-15: 1 g via INTRAVENOUS
  Filled 2022-02-15: qty 10

## 2022-02-15 NOTE — Discharge Instructions (Signed)
You likely have bronchitis versus early pneumonia  I recommend that you take doxycycline twice daily for a week.  You can also use albuterol 2 puffs every 4 hours as needed if you are wheezing  See your doctor for follow-up  Return to ER if you have worse wheezing, cough, fever

## 2022-02-15 NOTE — ED Triage Notes (Signed)
Per pt daughter, husband was hospitalized for pneumonia, pt started wheezing they called EMS was advised to seek treatment.

## 2022-02-15 NOTE — ED Notes (Signed)
Pt. Here with shob and has no reports of chest pain.  Pt. Is alert and oriented with daughter at bedside.  Pt. Has no noted wheezing.  Pt. Does have diminished lung snds bilat.

## 2022-02-15 NOTE — ED Provider Notes (Signed)
[facility] Provider Note   CSN: [id number] Arrival date & time: 02/15/22  2124     History  Chief Complaint  Patient presents with   Shortness of Breath    Shiela Bacigalupi is a 88 y.o. female hx of afib on eliquis, here presenting with shortness of breath and cough and low-grade temperature.  Patient States that her husband was admitted to the hospital for pneumonia several weeks ago. Patient started having some shortness of breath and wheezing today.  Patient also has some low-grade temperature and felt diffusely weak.  The history is provided by the patient.       Home Medications Prior to Admission medications   Medication Sig Start Date End Date Taking? Authorizing Provider  acetaminophen (TYLENOL) 500 MG tablet Take by mouth.    [provider]  apixaban (ELIQUIS) 5 MG TABS tablet TAKE 2 TABS TWICE A DAY FOR ONE WEEK THEN ONE TAB TWICE A DAY 03/25/19   [provider]  atorvastatin (LIPITOR) 80 MG tablet Take 1 tablet by mouth daily. 12/06/16   [provider]  Calcium Citrate-Vitamin D 315-250 MG-UNIT TABS Take by mouth.    [provider]  citalopram (CELEXA) 40 MG tablet Take 1 tablet by mouth daily. 04/04/17   [provider]  diclofenac Sodium (VOLTAREN) 1 % GEL Apply to affected area/heels up to 3 times daily as directed 02/13/19   [provider]  ferrous sulfate 325 (65 FE) MG tablet TAKE 1 TABLET BY MOUTH EVERY DAY WITH BREAKFAST 01/31/17   [provider]  furosemide (LASIX) 20 MG tablet Take by mouth. 05/04/20   [provider]  glucosamine-chondroitin 500-400 MG tablet Take 1 tablet by mouth 2 (two) times daily.    [provider]  latanoprost (XALATAN) 0.005 % ophthalmic solution Place 1 drop into both eyes nightly. 09/11/12   [provider]  Multiple Vitamins-Minerals (SYSTANE ICAPS AREDS2) CAPS Take by mouth.    [provider]  nitroGLYCERIN  (NITROSTAT) 0.4 MG SL tablet Place under the tongue. 01/19/15   [provider]  pantoprazole (PROTONIX) 20 MG tablet Take 1 tablet by mouth daily. 03/15/19   [provider]  timolol (TIMOPTIC) 0.5 % ophthalmic solution Place 1 drop into both eyes 2 times daily. 06/07/14   [provider]      Allergies    Contrast media [iodinated contrast media], Shellfish allergy, Shellfish-derived products, Codeine, Iodine, Latex, Prednisone, and Tramadol    Review of Systems   Review of Systems  Respiratory:  Positive for shortness of breath.   All other systems reviewed and are negative.   Physical Exam Updated Vital Signs BP (!) 153/83 (BP Location: Right Arm)   Pulse 76   Temp 99.2 F (37.3 C) (Oral)   Resp 20   Ht 5\' 2"  (1.575 m)   Wt 63.5 kg   SpO2 100%   BMI 25.61 kg/m  Physical Exam Vitals and nursing note reviewed.  Constitutional:      Comments: Chronically ill   HENT:     Head: Normocephalic.     Mouth/Throat:     Mouth: Mucous membranes are moist.  Eyes:     Extraocular Movements: Extraocular movements intact.     Pupils: Pupils are equal, round, and reactive to light.  Cardiovascular:     Rate and Rhythm: Normal rate and regular rhythm.  Pulmonary:     Comments: Diminished breath sounds bilaterally.  No obvious wheezing or crackles Musculoskeletal:  General: Normal range of motion.     Cervical back: Normal range of motion and neck supple.  Skin:    General: Skin is warm.     Capillary Refill: Capillary refill takes less than 2 seconds.  Neurological:     General: No focal deficit present.     Mental Status: She is alert and oriented to person, place, and time.     ED Results / Procedures / Treatments   Labs (all labs ordered are listed, but only abnormal results are displayed) Labs Reviewed  CBC WITH DIFFERENTIAL/PLATELET - Abnormal; Notable for the following components:      Result Value   RDW 15.6 (*)    Monocytes Absolute  1.1 (*)    All other components within normal limits  COMPREHENSIVE METABOLIC PANEL - Abnormal; Notable for the following components:   Glucose, Bld 106 (*)    Creatinine, Ser 1.13 (*)    GFR, Estimated 45 (*)    All other components within normal limits  I-STAT VENOUS BLOOD GAS, ED - Abnormal; Notable for the following components:   pO2, Ven 20 (*)    Bicarbonate 31.5 (*)    TCO2 33 (*)    Acid-Base Excess 6.0 (*)    All other components within normal limits  SARS CORONAVIRUS 2 BY RT PCR  CULTURE, BLOOD (ROUTINE X 2)  CULTURE, BLOOD (ROUTINE X 2)  LACTIC ACID, PLASMA  LACTIC ACID, PLASMA  BLOOD GAS, VENOUS    EKG EKG Interpretation  Date/Time:  Thursday February 15 2022 21:51:02 EDT Ventricular Rate:  77 PR Interval:    QRS Duration: 144 QT Interval:  432 QTC Calculation: 488 R Axis:   255 Text Interpretation: Ventricular-paced rhythm Abnormal ECG When compared with ECG of 06-Sep-2021 18:08, No significant change since last tracing Confirmed by Wandra Arthurs [phone]) on 02/15/2022 10:03:09 PM  Radiology DG Chest 2 View  Result Date: 02/15/2022 CLINICAL DATA:  SOB EXAM: CHEST - 2 VIEW COMPARISON:  Chest x-ray 09/06/2021, CT angiography chest 08/24/2013 FINDINGS: Left chest wall 2 lead pacemaker in similar position. The heart and mediastinal contours are unchanged. Aortic calcification Biapical pleural/pulmonary scarring. Chronic coarsened markings. No focal consolidation. No pulmonary edema. No pleural effusion. No pneumothorax. No acute osseous abnormality. IMPRESSION: 1. No active cardiopulmonary disease. 2.  Aortic Atherosclerosis (ICD10-I70.0). Electronically Signed   By: Iven Finn M.D.   On: 02/15/2022 22:24    Procedures Procedures    Medications Ordered in ED Medications  cefTRIAXone (ROCEPHIN) 1 g in sodium chloride 0.9 % 100 mL IVPB (1 g Intravenous New Bag/Given 02/15/22 2313)    ED Course/ Medical Decision Making/ A&P                           Medical Decision  Making Shanterra Moyer is a 88 y.o. female here presenting with cough and wheezing and possible pneumonia.  Patient's husband had pneumonia recently.  Consider bronchitis versus pneumonia. Plan to get CBC and BMP and lactate and cultures and chest x-ray.   11:36 PM I reviewed patient's labs and independently reviewed her chest x-ray.  Labs showed normal pH. VBG showed low PO2 which is expected.  White blood cell count is normal and lactate is normal.  Chest x-ray showed no pneumonia.  Clinically I think she at least had bronchitis versus early pneumonia.  Given that her husband has pneumonia, will empirically treat with doxycycline.  Patient is stable for discharge and she has  no oxygen requirement   Problems Addressed: Bronchitis: acute illness or injury  Amount and/or Complexity of Data Reviewed Labs: ordered. Decision-making details documented in ED Course. Radiology: ordered and independent interpretation performed. Decision-making details documented in ED Course. ECG/medicine tests: ordered and independent interpretation performed. Decision-making details documented in ED Course.  Risk Prescription drug management.    Final Clinical Impression(s) / ED Diagnoses Final diagnoses:  None    Rx / DC Orders ED Discharge Orders     None         Drenda Freeze, MD 02/15/22 2338

## 2022-02-16 NOTE — ED Notes (Signed)
Placed Pt. In depends before getting her ready for home.

## 2022-02-21 LAB — CULTURE, BLOOD (ROUTINE X 2)
Culture: NO GROWTH
Culture: NO GROWTH
Special Requests: ADEQUATE
Special Requests: ADEQUATE

## 2022-11-24 IMAGING — DX DG CHEST 2V
2 series · 2 of 2 positions shown · non-contrast
Comparison: Chest x-ray 09/06/2021, CT angiography chest 08/24/2013

CLINICAL DATA: SOB

EXAM:
CHEST - 2 VIEW

[chest ap]
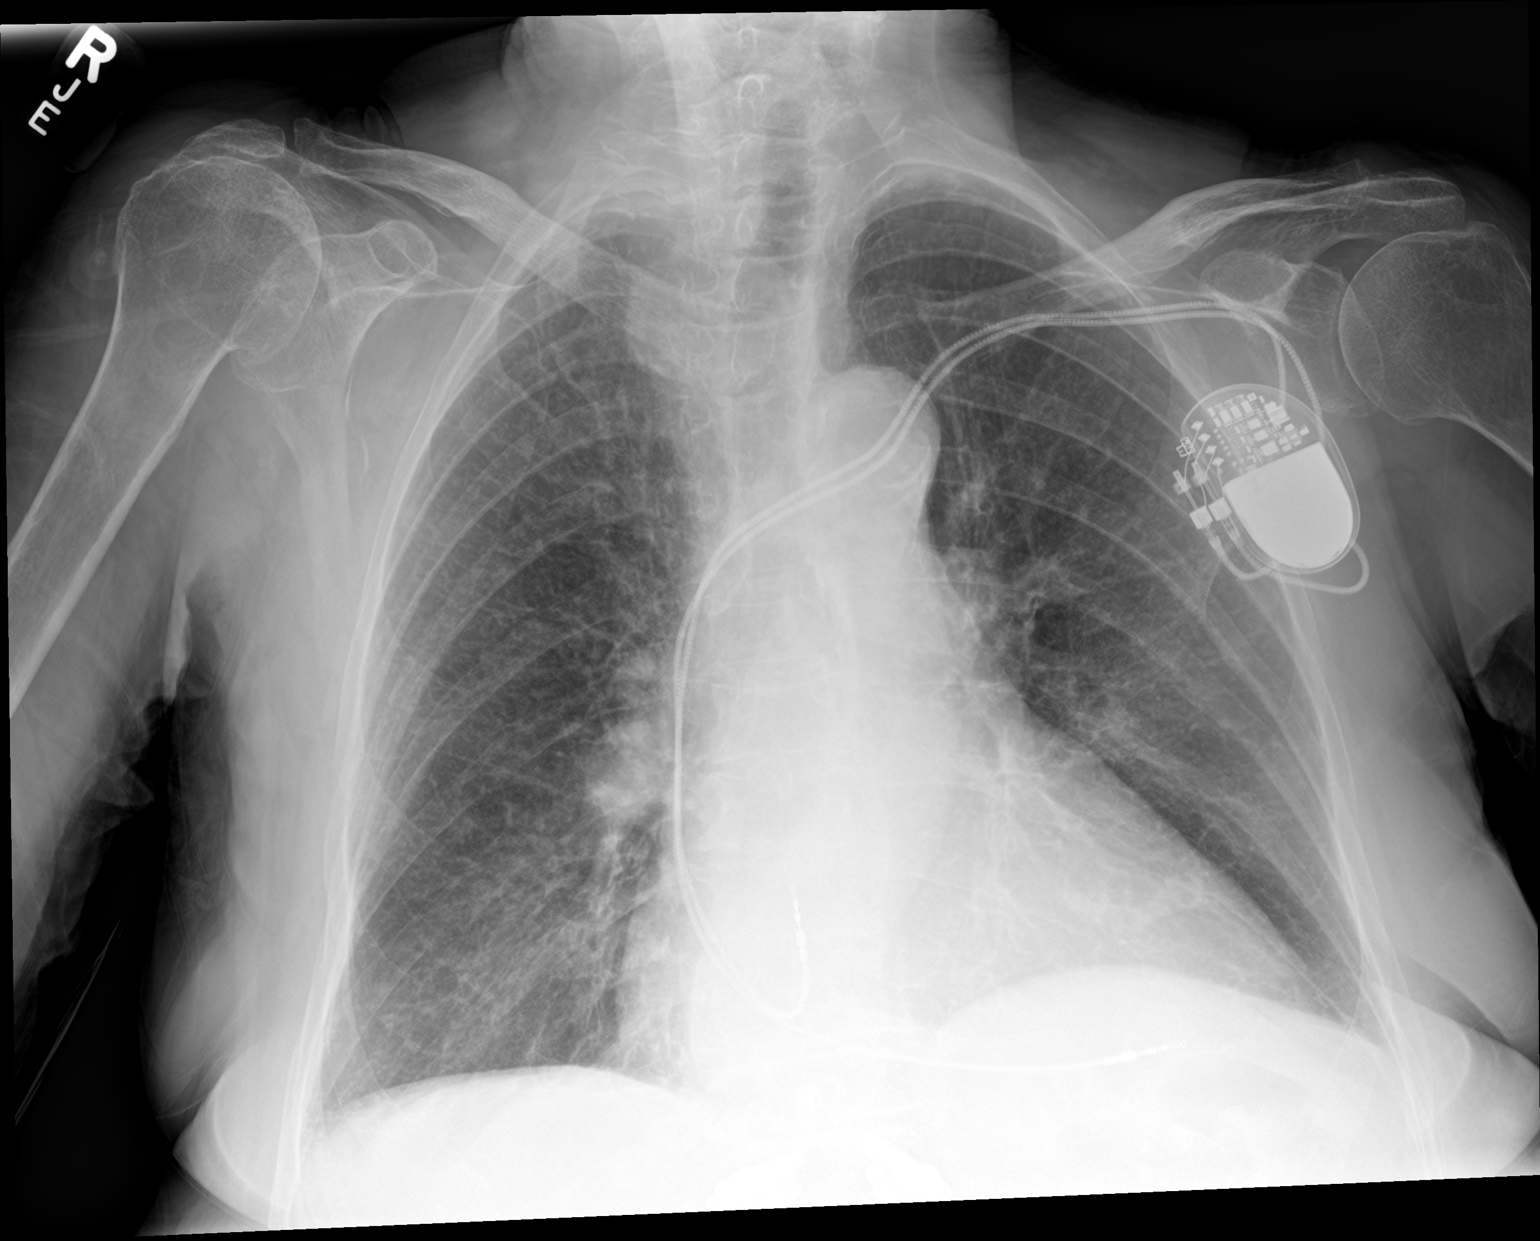

[chest lat]
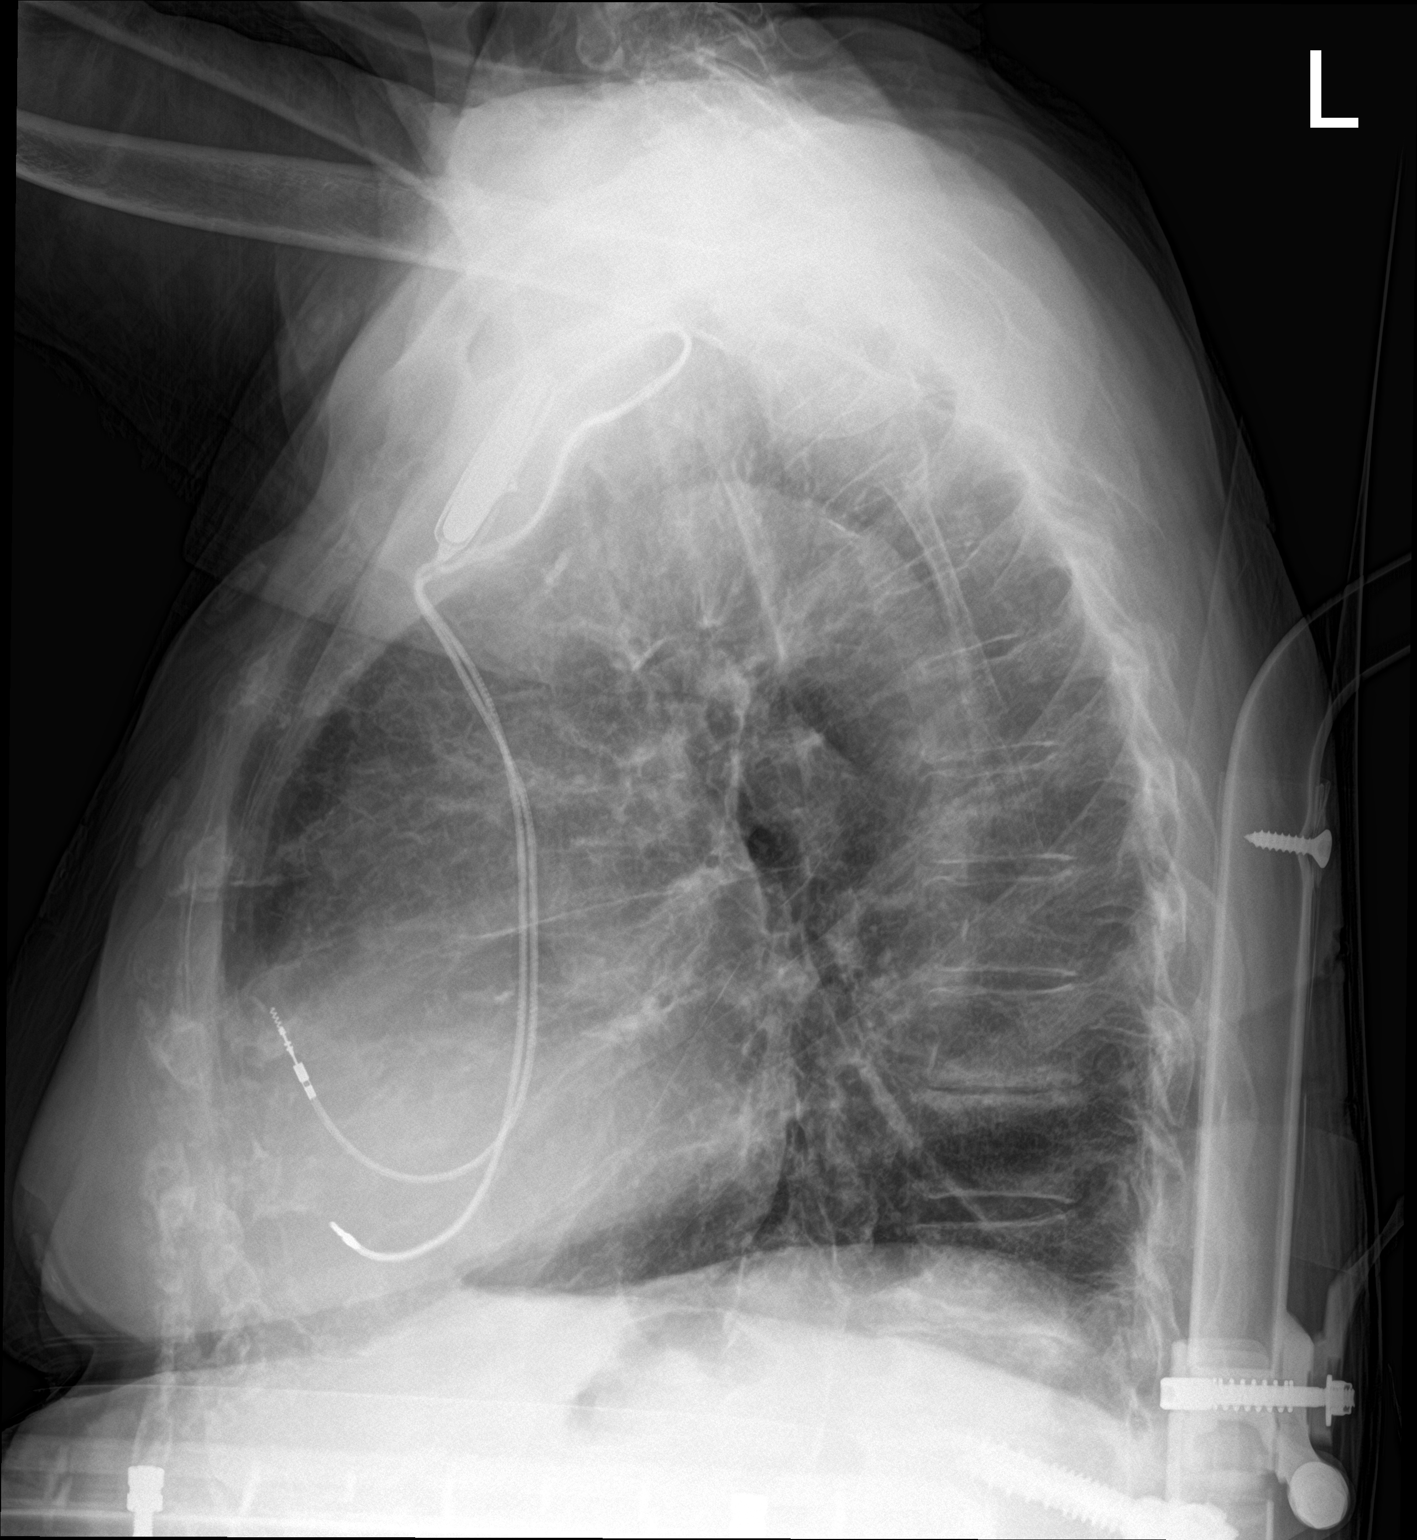

[2 of 2 positions shown; findings below may reference images not displayed]

FINDINGS: Left chest wall 2 lead pacemaker in similar position.

The heart and mediastinal contours are unchanged. Aortic
calcification

Biapical pleural/pulmonary scarring. Chronic coarsened markings. No
focal consolidation. No pulmonary edema. No pleural effusion. No
pneumothorax.

No acute osseous abnormality.
IMPRESSION: 1. No active cardiopulmonary disease.
2.  Aortic Atherosclerosis ([id number]).

## 2023-09-11 DEATH — deceased
